# Patient Record
Sex: Male | Born: 1970 | Race: Black or African American | Hispanic: No | Marital: Single | Smoking: Never smoker
Health system: Southern US, Community
[De-identification: ages and names within clinical notes are randomized; demographics above are authoritative.]

## PROBLEM LIST (undated history)

## (undated) ENCOUNTER — Emergency Department (HOSPITAL_COMMUNITY): Admission: EM | Payer: Self-pay | Source: Home / Self Care

---

## 2010-02-13 ENCOUNTER — Emergency Department (HOSPITAL_COMMUNITY): Admission: EM | Admit: 2010-02-13 | Discharge: 2010-02-13 | Payer: Self-pay | Admitting: Emergency Medicine

## 2015-11-09 ENCOUNTER — Emergency Department (HOSPITAL_COMMUNITY)
Admission: EM | Admit: 2015-11-09 | Discharge: 2015-11-10 | Discharge status: Against Medical Advice | Payer: No Typology Code available for payment source | Attending: Emergency Medicine | Admitting: Emergency Medicine

## 2015-11-09 ENCOUNTER — Encounter (HOSPITAL_COMMUNITY): Payer: Self-pay | Admitting: Emergency Medicine

## 2015-11-09 DIAGNOSIS — M545 Low back pain: Secondary | ICD-10-CM | POA: Diagnosis not present

## 2015-11-09 DIAGNOSIS — G8929 Other chronic pain: Secondary | ICD-10-CM | POA: Diagnosis not present

## 2015-11-09 NOTE — ED Notes (Signed)
Pt. reports chronic left lower back pain for 2 weeks denies injury or fall , seen at an urgent care prescribed with pain medications with no relief , denies hematuria or dysuria . Pt. states heavy lifting at work .

## 2015-11-10 ENCOUNTER — Emergency Department (HOSPITAL_COMMUNITY)
Admission: EM | Admit: 2015-11-10 | Discharge: 2015-11-10 | Disposition: A | Discharge status: Home / Self Care | Payer: Worker's Compensation | Attending: Emergency Medicine | Admitting: Emergency Medicine

## 2015-11-10 ENCOUNTER — Encounter (HOSPITAL_COMMUNITY): Payer: Self-pay | Admitting: Emergency Medicine

## 2015-11-10 DIAGNOSIS — Y9389 Activity, other specified: Secondary | ICD-10-CM | POA: Insufficient documentation

## 2015-11-10 DIAGNOSIS — S3992XA Unspecified injury of lower back, initial encounter: Secondary | ICD-10-CM | POA: Diagnosis present

## 2015-11-10 DIAGNOSIS — M62830 Muscle spasm of back: Secondary | ICD-10-CM | POA: Diagnosis not present

## 2015-11-10 DIAGNOSIS — Y9289 Other specified places as the place of occurrence of the external cause: Secondary | ICD-10-CM | POA: Diagnosis not present

## 2015-11-10 DIAGNOSIS — S39012A Strain of muscle, fascia and tendon of lower back, initial encounter: Secondary | ICD-10-CM | POA: Diagnosis not present

## 2015-11-10 DIAGNOSIS — Y990 Civilian activity done for income or pay: Secondary | ICD-10-CM | POA: Insufficient documentation

## 2015-11-10 DIAGNOSIS — X509XXA Other and unspecified overexertion or strenuous movements or postures, initial encounter: Secondary | ICD-10-CM | POA: Diagnosis not present

## 2015-11-10 MED ORDER — IBUPROFEN 600 MG PO TABS: 600.0000 mg | ORAL_TABLET | Freq: Four times a day (QID) | ORAL | Status: AC | PRN

## 2015-11-10 MED ORDER — KETOROLAC TROMETHAMINE 30 MG/ML IJ SOLN
30.0000 mg | Freq: Once | INTRAMUSCULAR | Status: AC
  Administered 2015-11-10: 30 mg via INTRAMUSCULAR
  Filled 2015-11-10: qty 1

## 2015-11-10 NOTE — ED Notes (Signed)
The pt is c/o lower back pain since he was lifting heavy appliances 2 weeks ago.  [facility]   But the med made him sleep.  He was on light duty at work but since he has gone back to his regular job his back has  Had more pain.  He thinks he needs a back xray

## 2015-11-10 NOTE — ED Notes (Signed)
Pt. reports left lower back and left foot pain for 2 weeks , denies injury or fall , ambulatory , pt. stated heavy lifting at work .

## 2015-11-10 NOTE — ED Provider Notes (Signed)
CSN: [id number]     Arrival date & time 11/10/15  4098 History   First MD Initiated Contact with Patient 11/10/15 0347     Chief Complaint  Patient presents with  . Back Pain     (Consider location/radiation/quality/duration/timing/severity/associated sxs/prior Treatment) HPI Comments: 44 year old male presents for back pain. The patient states that over the last 2 weeks ever since doing some very heavy lifting at work he has had pain in the left side of his back mostly in the lower back. He said it's made worse with walking and with bending and lifting heavy objects. He reports that he was seen for this about 2 weeks ago and was prescribed medications that helped but that when he returned to work they made him go right back to heavy lifting after a day of light work and that the pain then returned. He said he has not been able to control it since that time. Denies fevers or chills. No weakness, numbness, tingling. Reports normal bowel bladder function.   History reviewed. No pertinent past medical history. History reviewed. No pertinent past surgical history. No family history on file. Social History  Substance Use Topics  . Smoking status: Never Smoker   . Smokeless tobacco: None  . Alcohol Use: No    Review of Systems  Constitutional: Negative for fever, chills and fatigue.  HENT: Negative for congestion, postnasal drip and rhinorrhea.   Respiratory: Negative for cough, chest tightness, shortness of breath and wheezing.   Cardiovascular: Negative for chest pain and palpitations.  Gastrointestinal: Negative for nausea, vomiting, abdominal pain and diarrhea.  Genitourinary: Negative for dysuria, urgency and hematuria.  Musculoskeletal: Positive for back pain. Negative for joint swelling, neck pain and neck stiffness.  Skin: Negative for rash and wound.  Neurological: Negative for dizziness, weakness, light-headedness and numbness.  Hematological: Does not bruise/bleed easily.       Allergies  Review of patient's allergies indicates no known allergies.  Home Medications   Prior to Admission medications   Medication Sig Start Date End Date Taking? Authorizing Provider  ibuprofen (ADVIL,MOTRIN) 600 MG tablet Take 1 tablet (600 mg total) by mouth every 6 (six) hours as needed. 11/10/15   Leta Baptist, MD   BP 122/87 mmHg  Pulse 66  Temp(Src) 98 F (36.7 C) (Oral)  Resp 16  Ht  (1.626 m)  Wt 144 lb (65.318 kg)  BMI 24.71 kg/m2  SpO2 99% Physical Exam  Constitutional: He is oriented to person, place, and time. He appears well-developed and well-nourished. No distress.  HENT:  Head: Normocephalic and atraumatic.  Right Ear: External ear normal.  Left Ear: External ear normal.  Mouth/Throat: Oropharynx is clear and moist. No oropharyngeal exudate.  Eyes: EOM are normal. Pupils are equal, round, and reactive to light.  Neck: Normal range of motion. Neck supple.  Cardiovascular: Normal rate, regular rhythm, normal heart sounds and intact distal pulses.   No murmur heard. Pulmonary/Chest: Effort normal. No respiratory distress. He has no wheezes. He has no rales.  Abdominal: Soft. He exhibits no distension. There is no tenderness.  Musculoskeletal: He exhibits no edema.       Right hip: Normal.       Left hip: Normal.       Cervical back: Normal.       Thoracic back: He exhibits tenderness (left paraspinal muscles), pain and spasm. He exhibits normal range of motion and normal pulse.       Lumbar back: He exhibits tenderness (  paraspinal tenderness mostly on the leftside), pain and spasm. He exhibits no bony tenderness and normal pulse.  Neurological: He is alert and oriented to person, place, and time. He has normal strength. No cranial nerve deficit or sensory deficit. Gait normal.  Patient able to stand and walk on his toes as well as his heels  Skin: Skin is warm and dry. No rash noted. He is not diaphoretic.  Vitals reviewed.   ED Course   Procedures (including critical care time) Labs Review Labs Reviewed - No data to display  Imaging Review No results found. I have personally reviewed and evaluated these images and lab results as part of my medical decision-making.   EKG Interpretation None      MDM  Patient was seen and evaluated in stable condition. Normal neurologic examination. Tenderness over the paraspinal muscles. History consistent with back strain. At this time patient without any concerning a red flag symptoms. Discussed the option of possible x-ray but at this time felt clinically this was not necessary and patient felt comfortable with plan for conservative treatment and outpatient follow-up. Patient given a dose of IM Toradol. 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 Final diagnoses:  Back strain, initial encounter   1. Back strain    Leta BaptistEmily Roe Jelena Malicoat, MD 11/10/15 [phone]

## 2015-11-10 NOTE — ED Notes (Signed)
No answer times 2 when called to be placed in room

## 2015-11-10 NOTE — Discharge Instructions (Signed)
He were seen today for your ongoing back pain. It appears as though he strained her back muscles. Try to rest and do gentle stretching. Use ice or heat whichever feels better for symptom relief. Take the medication prescribed. Follow-up outpatient for reevaluation.  Lumbosacral Strain Lumbosacral strain is a strain of any of the parts that make up your lumbosacral vertebrae. Your lumbosacral vertebrae are the bones that make up the lower third of your backbone. Your lumbosacral vertebrae are held together by muscles and tough, fibrous tissue (ligaments).  CAUSES  A sudden blow to your back can cause lumbosacral strain. Also, anything that causes an excessive stretch of the muscles in the low back can cause this strain. This is typically seen when people exert themselves strenuously, fall, lift heavy objects, bend, or crouch repeatedly. RISK FACTORS  Physically demanding work.  Participation in pushing or pulling sports or sports that require a sudden twist of the back (tennis, golf, baseball).  Weight lifting.  Excessive lower back curvature.  Forward-tilted pelvis.  Weak back or abdominal muscles or both.  Tight hamstrings. SIGNS AND SYMPTOMS  Lumbosacral strain may cause pain in the area of your injury or pain that moves (radiates) down your leg.  DIAGNOSIS Your health care provider can often diagnose lumbosacral strain through a physical exam. In some cases, you may need tests such as X-ray exams.  TREATMENT  Treatment for your lower back injury depends on many factors that your clinician will have to evaluate. However, most treatment will include the use of anti-inflammatory medicines. HOME CARE INSTRUCTIONS   Avoid hard physical activities (tennis, racquetball, waterskiing) if you are not in proper physical condition for it. This may aggravate or create problems.  If you have a back problem, avoid sports requiring sudden body movements. Swimming and walking are generally safer  activities.  Maintain good posture.  Maintain a healthy weight.  For acute conditions, you may put ice on the injured area.  Put ice in a plastic bag.  Place a towel between your skin and the bag.  Leave the ice on for 20 minutes, 2-3 times a day.  When the low back starts healing, stretching and strengthening exercises may be recommended. SEEK MEDICAL CARE IF:  Your back pain is getting worse.  You experience severe back pain not relieved with medicines. SEEK IMMEDIATE MEDICAL CARE IF:   You have numbness, tingling, weakness, or problems with the use of your arms or legs.  There is a change in bowel or bladder control.  You have increasing pain in any area of the body, including your belly (abdomen).  You notice shortness of breath, dizziness, or feel faint.  You feel sick to your stomach (nauseous), are throwing up (vomiting), or become sweaty.  You notice discoloration of your toes or legs, or your feet get very cold. MAKE SURE YOU:   Understand these instructions.  Will watch your condition.  Will get help right away if you are not doing well or get worse.   This information is not intended to replace advice given to you by your health care provider. Make sure you discuss any questions you have with your health care provider.   Document Released: 08/09/2005 Document Revised: 11/20/2014 Document Reviewed: 06/18/2013 Elsevier Interactive Patient Education [employer]2016 Elsevier Inc.

## 2018-09-16 ENCOUNTER — Other Ambulatory Visit: Payer: Self-pay

## 2018-09-16 ENCOUNTER — Emergency Department (HOSPITAL_COMMUNITY): Payer: Self-pay

## 2018-09-16 ENCOUNTER — Emergency Department (HOSPITAL_COMMUNITY)
Admission: EM | Admit: 2018-09-16 | Discharge: 2018-09-17 | Disposition: A | Discharge status: Home / Self Care | Payer: Self-pay | Attending: Emergency Medicine | Admitting: Emergency Medicine

## 2018-09-16 ENCOUNTER — Encounter (HOSPITAL_COMMUNITY): Payer: Self-pay | Admitting: Emergency Medicine

## 2018-09-16 DIAGNOSIS — M791 Myalgia, unspecified site: Secondary | ICD-10-CM | POA: Insufficient documentation

## 2018-09-16 DIAGNOSIS — R059 Cough, unspecified: Secondary | ICD-10-CM

## 2018-09-16 DIAGNOSIS — R0981 Nasal congestion: Secondary | ICD-10-CM | POA: Insufficient documentation

## 2018-09-16 DIAGNOSIS — R05 Cough: Secondary | ICD-10-CM | POA: Insufficient documentation

## 2018-09-16 DIAGNOSIS — R079 Chest pain, unspecified: Secondary | ICD-10-CM | POA: Insufficient documentation

## 2018-09-16 NOTE — ED Triage Notes (Signed)
Pt reports body aches, cough, and congestion that started last night.

## 2018-09-16 NOTE — ED Notes (Signed)
Patient transported to X-ray 

## 2018-09-17 NOTE — Discharge Instructions (Signed)

## 2018-09-17 NOTE — ED Provider Notes (Signed)
[facility] EMERGENCY DEPARTMENT Provider Note   CSN: [id number] Arrival date & time: 09/16/18  2212     History   Chief Complaint Chief Complaint  Patient presents with  . Influenza    HPI Lucas Phelps is a 47 y.o. male resents today for evaluation of body aches, cough, and nasal congestion since yesterday.  He reports that he started coughing and shortly after he started having soreness in his bilateral upper posterior shoulders.  He any personal cardiac history.  No family cardiac history of heart attacks before the age of 62.  He denies any shortness of breath.  He says that his nasal congestion improved after he took allergy medicine this morning.  He has not taken any ibuprofen or Tylenol at home for the pain.  He has never smoked.  His cough is nonproductive.   HPI  History reviewed. No pertinent past medical history.  There are no active problems to display for this patient.   History reviewed. No pertinent surgical history.      Home Medications    Prior to Admission medications   Medication Sig Start Date End Date Taking? Authorizing Provider  ibuprofen (ADVIL,MOTRIN) 600 MG tablet Take 1 tablet (600 mg total) by mouth every 6 (six) hours as needed. 11/10/15   Leta Baptist, MD    Family History No family history on file.  Social History Social History   Tobacco Use  . Smoking status: Never Smoker  . Smokeless tobacco: Never Used  Substance Use Topics  . Alcohol use: No  . Drug use: No     Allergies   Patient has no known allergies.   Review of Systems Review of Systems  Constitutional: Negative for chills and fever.  Respiratory: Positive for cough. Negative for shortness of breath.   Cardiovascular: Negative for chest pain, palpitations and leg swelling.  Gastrointestinal: Positive for diarrhea (1-2 loose stools yesterday). Negative for abdominal pain, nausea and vomiting.  All other systems reviewed and are  negative.    Physical Exam Updated Vital Signs BP 133/88 (BP Location: Right Arm)   Pulse 82   Temp 98.1 F (36.7 C) (Oral)   Resp 18   Ht 5\' 4"  (1.626 m)   Wt 68.9 kg   SpO2 99%   BMI 26.09 kg/m   Physical Exam  Constitutional: He appears well-developed and well-nourished. No distress.  HENT:  Head: Normocephalic and atraumatic.  Right Ear: Tympanic membrane, external ear and ear canal normal.  Left Ear: Tympanic membrane, external ear and ear canal normal.  Nose: Mucosal edema and rhinorrhea present.  Mouth/Throat: Uvula is midline, oropharynx is clear and moist and mucous membranes are normal. No oropharyngeal exudate. No tonsillar exudate.  Eyes: Conjunctivae are normal. No scleral icterus.  Neck: Normal range of motion. Neck supple.  Cardiovascular: Normal rate, regular rhythm, normal heart sounds and intact distal pulses.  Pulmonary/Chest: Effort normal and breath sounds normal. No respiratory distress. He has no wheezes. He exhibits tenderness (Bilateral anterior chest tenderness to palpation above the clavicles).  Musculoskeletal:  There is tenderness to palpation over bilateral trapezius muscles.  Palpation over this area both re-creates and exacerbates his reported tightness/discomfort.    Lymphadenopathy:    He has no cervical adenopathy.  Neurological: He is alert.  Skin: Skin is warm and dry. He is not diaphoretic.  Psychiatric: He has a normal mood and affect. His behavior is normal.  Nursing note and vitals reviewed.    ED Treatments /  Results  Labs (all labs ordered are listed, but only abnormal results are displayed) Labs Reviewed - No data to display  EKG EKG Interpretation  Date/Time:  Monday September 16 2018 22:49:17 EST Ventricular Rate:  76 PR Interval:    QRS Duration: 69 QT Interval:  362 QTC Calculation: 407 R Axis:   57 Text Interpretation:  Sinus rhythm Confirmed by Kristine Royal [phone]) on 09/16/2018 10:55:15 PM   Radiology Dg  Chest 2 View  Result Date: 09/16/2018 CLINICAL DATA:  Cough body ache EXAM: CHEST - 2 VIEW COMPARISON:  02/13/2010 FINDINGS: The heart size and mediastinal contours are within normal limits. Both lungs are clear. The visualized skeletal structures are unremarkable. IMPRESSION: No active cardiopulmonary disease. Electronically Signed   By: Jasmine Pang M.D.   On: 09/16/2018 23:29    Procedures Procedures (including critical care time)  Medications Ordered in ED Medications - No data to display   Initial Impression / Assessment and Plan / ED Course  I have reviewed the triage vital signs and the nursing notes.  Pertinent labs & imaging results that were available during my care of the patient were reviewed by me and considered in my medical decision making (see chart for details).    Pt CXR negative for acute infiltrate. Patients symptoms are consistent with URI, likely viral etiology.  Patient did voice that after he started coughing he had pain localized to his bilateral posterior shoulders that does not radiate or move.  EKG was obtained without evidence of acute abnormalities.  Given that he started with URI-like symptoms, and pain is re-created with palpation, is consistent with musculoskeletal pain from coughing.  PERC negative.  Discussed that antibiotics are not indicated for viral infections. Pt will be discharged with symptomatic treatment.  Verbalizes understanding and is agreeable with plan. Pt is hemodynamically stable & in NAD prior to dc.  Final Clinical Impressions(s) / ED Diagnoses   Final diagnoses:  Cough    ED Discharge Orders    None       Norman Clay 09/17/18 0007    Dione Booze, MD 09/17/18 [phone]

## 2018-10-31 ENCOUNTER — Encounter (HOSPITAL_COMMUNITY): Payer: Self-pay | Admitting: Emergency Medicine

## 2018-10-31 ENCOUNTER — Emergency Department (HOSPITAL_COMMUNITY): Payer: Self-pay

## 2018-10-31 ENCOUNTER — Other Ambulatory Visit: Payer: Self-pay

## 2018-10-31 ENCOUNTER — Emergency Department (HOSPITAL_COMMUNITY)
Admission: EM | Admit: 2018-10-31 | Discharge: 2018-10-31 | Disposition: A | Discharge status: Home / Self Care | Payer: Self-pay | Attending: Emergency Medicine | Admitting: Emergency Medicine

## 2018-10-31 DIAGNOSIS — J069 Acute upper respiratory infection, unspecified: Secondary | ICD-10-CM | POA: Insufficient documentation

## 2018-10-31 DIAGNOSIS — B9789 Other viral agents as the cause of diseases classified elsewhere: Secondary | ICD-10-CM

## 2018-10-31 MED ORDER — IBUPROFEN 800 MG PO TABS
800.0000 mg | ORAL_TABLET | Freq: Once | ORAL | Status: AC
  Administered 2018-10-31: 800 mg via ORAL
  Filled 2018-10-31: qty 1

## 2018-10-31 MED ORDER — ACETAMINOPHEN 500 MG PO TABS
1000.0000 mg | ORAL_TABLET | Freq: Once | ORAL | Status: AC
  Administered 2018-10-31: 1000 mg via ORAL
  Filled 2018-10-31: qty 2

## 2018-10-31 NOTE — ED Notes (Signed)
Patient transported to X-ray 

## 2018-10-31 NOTE — ED Provider Notes (Signed)
TIME SEEN: 4:57 AM  CHIEF COMPLAINT: Cough, nasal congestion  HPI: Patient is a 47 year old male with no significant past medical history who presents to the emergency department with 5 days of nasal congestion, cough.  States he feels like his cough is getting increasingly worse.  And has mostly been dry, nonproductive.  Denies any fevers.  Has had some mild bilateral throbbing headache and body aches.  No sore throat.  No vomiting or diarrhea.  No known sick contacts.  No recent travel.  Has not had an influenza vaccination.  States he is tried over-the-counter medications and feels like they are not helping with his symptoms and that is why he came to the ED today.  ROS: See HPI Constitutional: no fever  Eyes: no drainage  ENT: no runny nose   Cardiovascular:  no chest pain  Resp: no SOB  GI: no vomiting GU: no dysuria Integumentary: no rash  Allergy: no hives  Musculoskeletal: no leg swelling  Neurological: no slurred speech ROS otherwise negative  PAST MEDICAL HISTORY/PAST SURGICAL HISTORY:  History reviewed. No pertinent past medical history.  MEDICATIONS:  Prior to Admission medications   Medication Sig Start Date End Date Taking? Authorizing Provider  ibuprofen (ADVIL,MOTRIN) 600 MG tablet Take 1 tablet (600 mg total) by mouth every 6 (six) hours as needed. 11/10/15   Leta BaptistNguyen, Emily Roe, MD    ALLERGIES:  No Known Allergies  SOCIAL HISTORY:  Social History   Tobacco Use  . Smoking status: Never Smoker  . Smokeless tobacco: Never Used  Substance Use Topics  . Alcohol use: No    FAMILY HISTORY: No family history on file.  EXAM: BP (!) 144/103   Pulse 68   Wt 69 kg   SpO2 100%   BMI 26.11 kg/m Temp 98.6 F RR 22 CONSTITUTIONAL: Alert and oriented and responds appropriately to questions. Well-appearing; well-nourished, afebrile, nontoxic HEAD: Normocephalic EYES: Conjunctivae clear, pupils appear equal, EOMI ENT: normal nose; moist mucous membranes; No  pharyngeal erythema or petechiae, no tonsillar hypertrophy or exudate, no uvular deviation, no unilateral swelling, no trismus or drooling, no muffled voice, normal phonation, no stridor, no dental caries present, no drainable dental abscess noted, no Ludwig's angina, tongue sits flat in the bottom of the mouth, no angioedema, no facial erythema or warmth, no facial swelling; no pain with movement of the neck. NECK: Supple, no meningismus, no nuchal rigidity, no LAD  CARD: RRR; S1 and S2 appreciated; no murmurs, no clicks, no rubs, no gallops RESP: Normal chest excursion without splinting or tachypnea; breath sounds clear and equal bilaterally; no wheezes, no rhonchi, no rales, no hypoxia or respiratory distress, speaking full sentences ABD/GI: Normal bowel sounds; non-distended; soft, non-tender, no rebound, no guarding, no peritoneal signs, no hepatosplenomegaly BACK:  The back appears normal and is non-tender to palpation, there is no CVA tenderness EXT: Normal ROM in all joints; non-tender to palpation; no edema; normal capillary refill; no cyanosis, no calf tenderness or swelling    SKIN: Normal color for age and race; warm; no rash NEURO: Moves all extremities equally PSYCH: The patient's mood and manner are appropriate. Grooming and personal hygiene are appropriate.  MEDICAL DECISION MAKING: Patient here with likely viral upper respiratory infection.  He feels his cough is getting worse.  Will obtain chest x-ray to rule out pneumonia.  Will treat symptomatically with ibuprofen.  Doubt meningitis, bacteremia, sepsis.  He is well-appearing on exam.  Discussed with patient that viral illnesses can sometimes take 7 to  14 days to improve and have recommended over-the-counter medications for symptomatic relief.   Doubt flu given no fever.  Patient would be outside treatment window for Tamiflu given symptoms for 5 days.  ED PROGRESS: Patient's chest x-ray is clear.  I feel he is safe to be discharged  home.  Suspect viral illness.  I do not feel he needs antibiotics.   At this time, I do not feel there is any life-threatening condition present. I have reviewed and discussed all results (EKG, imaging, lab, urine as appropriate) and exam findings with patient/family. I have reviewed nursing notes and appropriate previous records.  I feel the patient is safe to be discharged home without further emergent workup and can continue workup as an outpatient as needed. Discussed usual and customary return precautions. Patient/family verbalize understanding and are comfortable with this plan.  Outpatient follow-up has been provided as needed. All questions have been answered.      Brendaliz Kuk, Layla MawKristen N, DO 10/31/18 [phone]

## 2018-10-31 NOTE — ED Triage Notes (Signed)
Pt in from home with c/o sinus congestion x 3 days - also c/o aches and cough, denies fevers. States he has been taking sinus medication with no relief. Temp 98.6, breath sounds clear

## 2018-10-31 NOTE — Discharge Instructions (Signed)
You may alternate Tylenol 1000 mg every 6 hours as needed for fever and pain and ibuprofen 800 mg every 8 hours as needed for fever and pain. Please rest and drink plenty of fluids. This is a viral illness causing your symptoms. You do not need antibiotics for a virus. You may use over-the-counter nasal saline spray and Afrin nasal saline spray as needed for nasal congestion. Please do not use Afrin for more than 3 days in a row. You may use guaifenesin and dextromethorphan as needed for cough.  You may use lozenges and Chloraseptic spray to help with sore throat.  Warm salt water gargles can also help with sore throat.  You may use over-the-counter Unisom (doxyalamine) or Benadryl to help with sleep.  Please note that some combination medicines such as DayQuil and NyQuil have multiple medications in them.  Please make sure you look at all labels to ensure that you are not taking too much of any one particular medication.  Symptoms from a virus may take 7-14 days to run its course.    Steps to find a Primary Care Provider (PCP):  Call [phone] or [phone] to access "[place] Find a Doctor Service."  2.  You may also go on the [facility] website at [email or web address]  3.   and Wellness also frequently accepts new patients.  [facility]  [address] [place] [id number] [phone]  4.  There are also multiple [facility], [facility], [place] and [facility] practices throughout the [place] that are frequently accepting new patients. You may find a clinic that is close to your home and contact them.  [email or web address] [phone]  [facility] [email or web address]  [email or web address] [phone]  [email or web address] [phone]  5.  Local Health Departments also can provide primary care services.  [facility]  [address] [place] [id number] [phone]  [facility] [address] [place] [address] [phone]  [facility] [address] [place] [address]  [place] [phone]

## 2020-02-29 IMAGING — CR DG CHEST 2V
2 series · 2 of 2 positions shown · non-contrast
Comparison: 09/16/2018

CLINICAL DATA: Cough and congestion

EXAM:
CHEST - 2 VIEW

[chest pa]
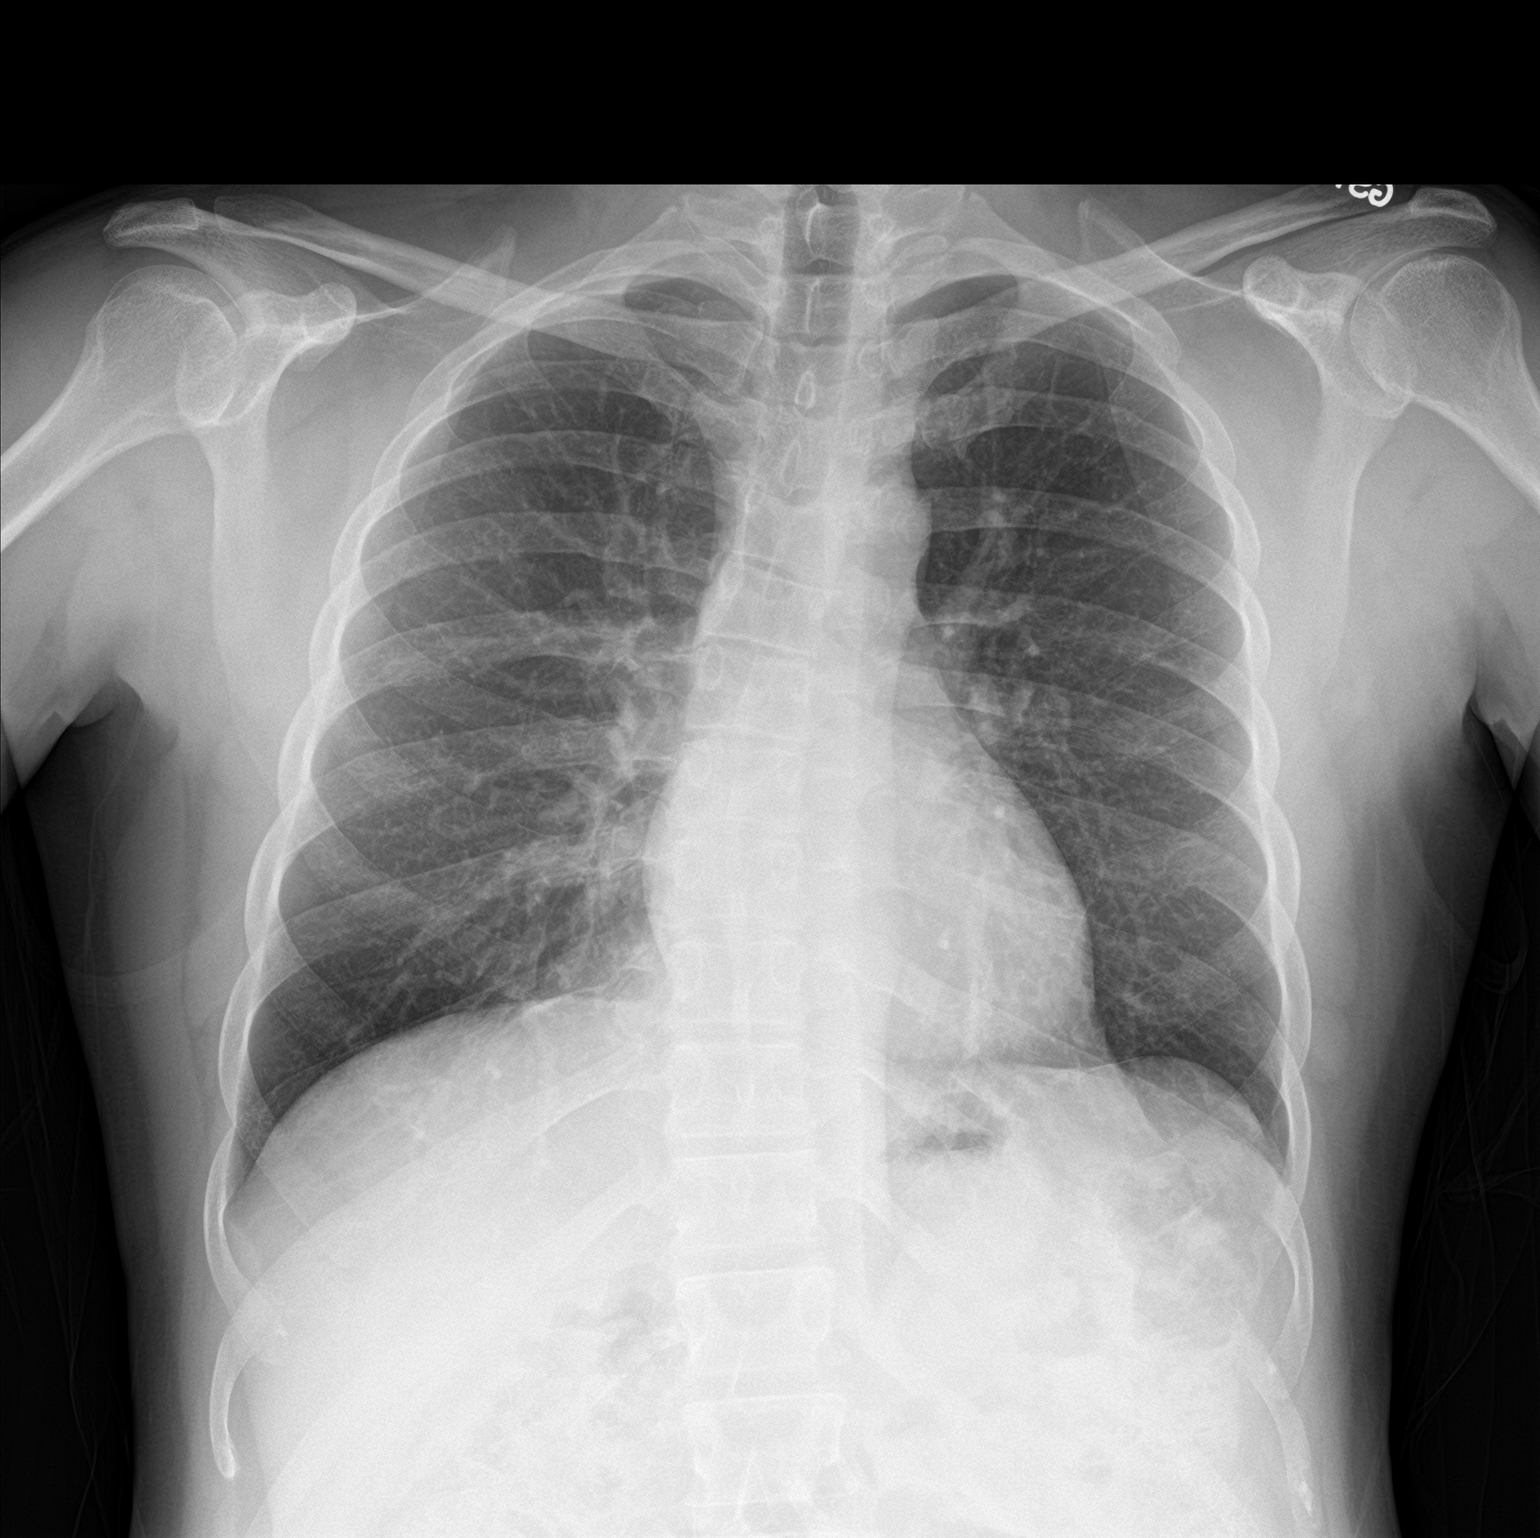

[chest lat]
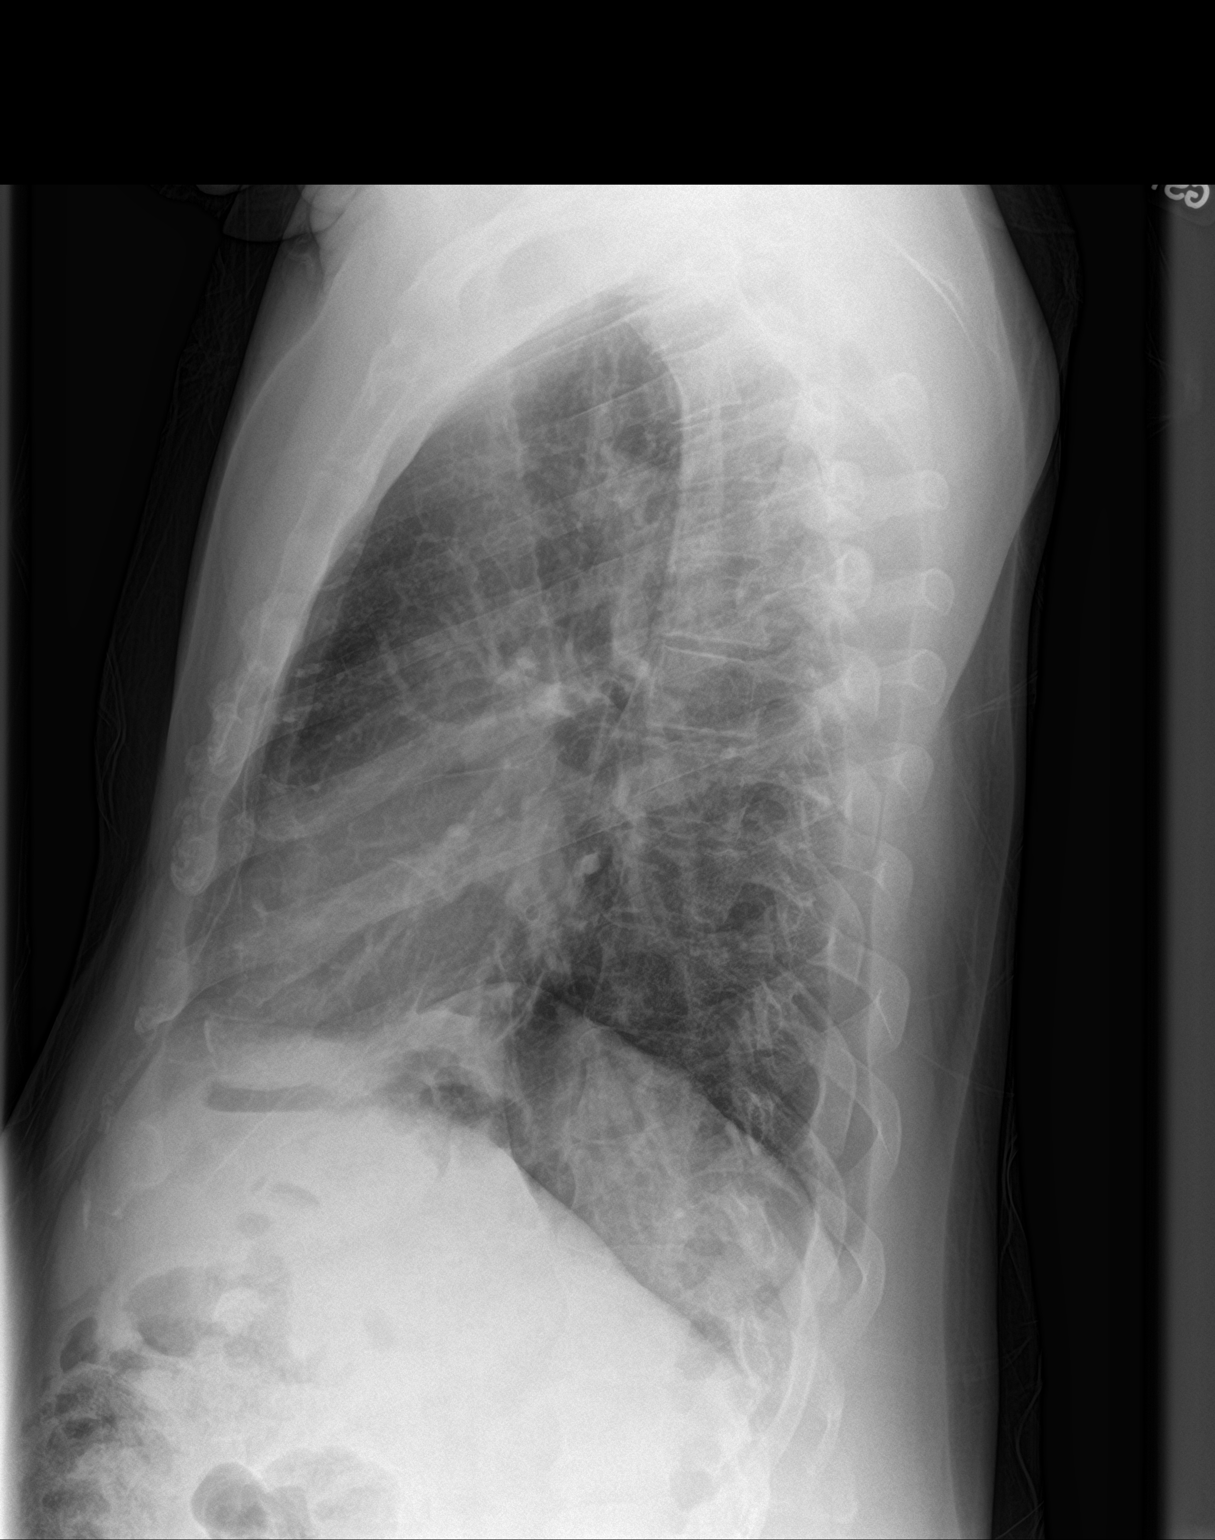

[2 of 2 positions shown; findings below may reference images not displayed]

FINDINGS: The heart size and mediastinal contours are within normal limits.
Both lungs are clear. The visualized skeletal structures are
unremarkable.
IMPRESSION: No active cardiopulmonary disease.
